# Patient Record
Sex: Female | Born: 1960 | Race: White | Hispanic: No | Marital: Married | Smoking: Never smoker
Health system: Southern US, Community
[De-identification: ages and names within clinical notes are randomized; demographics above are authoritative.]

## PROBLEM LIST (undated history)

## (undated) DIAGNOSIS — E119 Type 2 diabetes mellitus without complications: Secondary | ICD-10-CM

## (undated) DIAGNOSIS — N289 Disorder of kidney and ureter, unspecified: Secondary | ICD-10-CM

## (undated) DIAGNOSIS — I509 Heart failure, unspecified: Secondary | ICD-10-CM

## (undated) DIAGNOSIS — I10 Essential (primary) hypertension: Secondary | ICD-10-CM

## (undated) DIAGNOSIS — K5090 Crohn's disease, unspecified, without complications: Secondary | ICD-10-CM

## (undated) DIAGNOSIS — K3184 Gastroparesis: Secondary | ICD-10-CM

## (undated) DIAGNOSIS — I252 Old myocardial infarction: Secondary | ICD-10-CM

## (undated) HISTORY — PX: NEPHRECTOMY: SHX65

## (undated) HISTORY — PX: TONSILLECTOMY: SUR1361

## (undated) HISTORY — PX: ABDOMINAL HYSTERECTOMY: SHX81

---

## 2018-02-26 ENCOUNTER — Emergency Department (HOSPITAL_COMMUNITY)
Admission: EM | Admit: 2018-02-26 | Discharge: 2018-02-26 | Disposition: A | Discharge status: Home / Self Care | Payer: BLUE CROSS/BLUE SHIELD | Attending: Emergency Medicine | Admitting: Emergency Medicine

## 2018-02-26 ENCOUNTER — Other Ambulatory Visit: Payer: Self-pay

## 2018-02-26 ENCOUNTER — Encounter (HOSPITAL_COMMUNITY): Payer: Self-pay | Admitting: Emergency Medicine

## 2018-02-26 DIAGNOSIS — I509 Heart failure, unspecified: Secondary | ICD-10-CM | POA: Diagnosis not present

## 2018-02-26 DIAGNOSIS — Z794 Long term (current) use of insulin: Secondary | ICD-10-CM | POA: Insufficient documentation

## 2018-02-26 DIAGNOSIS — E11641 Type 2 diabetes mellitus with hypoglycemia with coma: Secondary | ICD-10-CM | POA: Diagnosis present

## 2018-02-26 DIAGNOSIS — E162 Hypoglycemia, unspecified: Secondary | ICD-10-CM

## 2018-02-26 DIAGNOSIS — I110 Hypertensive heart disease with heart failure: Secondary | ICD-10-CM | POA: Insufficient documentation

## 2018-02-26 DIAGNOSIS — Z79899 Other long term (current) drug therapy: Secondary | ICD-10-CM | POA: Insufficient documentation

## 2018-02-26 HISTORY — DX: Essential (primary) hypertension: I10

## 2018-02-26 HISTORY — DX: Disorder of kidney and ureter, unspecified: N28.9

## 2018-02-26 HISTORY — DX: Heart failure, unspecified: I50.9

## 2018-02-26 HISTORY — DX: Old myocardial infarction: I25.2

## 2018-02-26 HISTORY — DX: Type 2 diabetes mellitus without complications: E11.9

## 2018-02-26 HISTORY — DX: Crohn's disease, unspecified, without complications: K50.90

## 2018-02-26 LAB — CBC WITH DIFFERENTIAL/PLATELET
BASOS ABS: 0 10*3/uL (ref 0.0–0.1)
BASOS PCT: 0 %
EOS ABS: 0 10*3/uL (ref 0.0–0.7)
EOS PCT: 0 %
HCT: 41.8 % (ref 36.0–46.0)
Hemoglobin: 13.4 g/dL (ref 12.0–15.0)
Lymphocytes Relative: 7 %
Lymphs Abs: 0.9 10*3/uL (ref 0.7–4.0)
MCH: 28.6 pg (ref 26.0–34.0)
MCHC: 32.1 g/dL (ref 30.0–36.0)
MCV: 89.1 fL (ref 78.0–100.0)
Monocytes Absolute: 1.4 10*3/uL — ABNORMAL HIGH (ref 0.1–1.0)
Monocytes Relative: 10 %
Neutro Abs: 11.6 10*3/uL — ABNORMAL HIGH (ref 1.7–7.7)
Neutrophils Relative %: 83 %
PLATELETS: 176 10*3/uL (ref 150–400)
RBC: 4.69 MIL/uL (ref 3.87–5.11)
RDW: 13.9 % (ref 11.5–15.5)
WBC: 14 10*3/uL — AB (ref 4.0–10.5)

## 2018-02-26 LAB — CBG MONITORING, ED
GLUCOSE-CAPILLARY: 107 mg/dL — AB (ref 65–99)
GLUCOSE-CAPILLARY: 51 mg/dL — AB (ref 65–99)
Glucose-Capillary: 86 mg/dL (ref 65–99)

## 2018-02-26 LAB — BASIC METABOLIC PANEL
ANION GAP: 11 (ref 5–15)
BUN: 25 mg/dL — AB (ref 6–20)
CO2: 28 mmol/L (ref 22–32)
Calcium: 10 mg/dL (ref 8.9–10.3)
Chloride: 100 mmol/L — ABNORMAL LOW (ref 101–111)
Creatinine, Ser: 0.99 mg/dL (ref 0.44–1.00)
GFR calc Af Amer: >60 mL/min (ref >60)
Glucose, Bld: 54 mg/dL — ABNORMAL LOW (ref 65–99)
POTASSIUM: 3.7 mmol/L (ref 3.5–5.1)
SODIUM: 139 mmol/L (ref 135–145)

## 2018-02-26 NOTE — ED Notes (Signed)
CBG of 51 taken EDP in room while collected.

## 2018-02-26 NOTE — ED Triage Notes (Signed)
cbg 31 upon ems arrival, pt was unresponsive.  Pt given glucagon and 1 amp d50 by ems.  Pt is awake and alert at this time.  Last cbg 109 per ems

## 2018-02-26 NOTE — ED Provider Notes (Signed)
[facility] DEPT Provider Note: Andrea Spurling, MD, FACEP  CSN: [id number] MRN: [id number] ARRIVAL: 02/26/18 at [place]: [place]  Hypoglycemia   HISTORY OF PRESENT ILLNESS  02/26/18 5:51 PM Andrea Hines is a 57 y.o. female with a history of diabetes on insulin.  She has not eaten since breakfast.  She felt her sugar was low and was attempting to prepare a meal when she became unresponsive.  EMS was called and they found her blood sugar to be 31.  They gave her IM glucagon and then IV D50 with improvement in her mental status and a repeat sugar of 109.  She is complaining of being cold.  She has a history of Crohn's disease and had profuse diarrhea yesterday but without blood.  She denies abdominal pain.  She is complaining of generalized pain.   Past Medical History:  Diagnosis Date  . CHF (congestive heart failure) ([place])   . Crohn disease ([place])   . Diabetes ([place])   . Hypertension   . MI, old   . Renal disorder    pt has one kidney     Past Surgical History:  Procedure Laterality Date  . ABDOMINAL HYSTERECTOMY    . NEPHRECTOMY Left   . TONSILLECTOMY      No family history on file.  Social History   Tobacco Use  . Smoking status: Never Smoker  . Smokeless tobacco: Never Used  Substance Use Topics  . Alcohol use: Not Currently  . Drug use: Not Currently    Prior to Admission medications   Medication Sig Start Date End Date Taking? Authorizing Provider  buPROPion (WELLBUTRIN XL) 300 MG 24 hr tablet Take 300 mg by mouth every morning. 11/16/17  Yes [provider]  cyanocobalamin (,VITAMIN B-12,) 1000 MCG/ML injection INJECT 1 ML INTO THE MUSCLE EVERY THIRTY DAYS 01/18/18  Yes [provider]  DULoxetine (CYMBALTA) 30 MG capsule Take 30 mg by mouth at bedtime. 02/16/18 03/18/18 Yes [provider]  famotidine (PEPCID) 40 MG tablet TAKE ONE TABLET BY MOUTH TWICE A DAY 01/18/18  Yes [provider]    Fluticasone-Salmeterol (ADVAIR DISKUS) 250-50 MCG/DOSE AEPB Inhale 1 puff into the lungs every 12 (twelve) hours as needed.   Yes [provider]  hydrochlorothiazide (HYDRODIURIL) 25 MG tablet  12/11/17  Yes [provider]  HYDROcodone-acetaminophen (NORCO/VICODIN) 5-325 MG tablet Norco 5 mg-325 mg tablet  Take 1 tablet every 4-6 hours by oral route as needed.   Yes [provider]  insulin aspart (NOVOLOG) 100 UNIT/ML FlexPen Inject 10 Units into the skin 3 (three) times daily with meals.   Yes [provider]  insulin degludec (TRESIBA FLEXTOUCH) 100 UNIT/ML SOPN FlexTouch Pen Inject 60 Units into the skin 3 (three) times daily.  02/19/18  Yes [provider]  losartan (COZAAR) 50 MG tablet Take 50 mg by mouth daily. 12/19/17  Yes [provider]  metFORMIN (GLUCOPHAGE-XR) 500 MG 24 hr tablet Take 1,000 mg by mouth 2 (two) times daily. 01/18/18  Yes [provider]  montelukast (SINGULAIR) 10 MG tablet Take 10 mg by mouth at bedtime. 10/23/17  Yes [provider]  pregabalin (LYRICA) 150 MG capsule Take 150 mg by mouth 2 (two) times daily. 12/19/17  Yes [provider]  rOPINIRole (REQUIP) 1 MG tablet Take 1 mg by mouth 3 (three) times daily. 08/24/17  Yes [provider]  traZODone (DESYREL) 100 MG tablet Take 200 mg by mouth at bedtime.  11/16/17 11/16/18 Yes [provider]  pioglitazone (ACTOS) 30 MG tablet Take 30 mg by mouth daily. 12/19/17   [provider]    Allergies Vancomycin; Levaquin [levofloxacin]; Penicillins; and Remicade [infliximab]   REVIEW OF SYSTEMS  Negative except as noted here or in the History of Present Illness.   PHYSICAL EXAMINATION  Initial Vital Signs There were no vitals taken for this visit.  Examination General: Well-developed, well-nourished female in no acute distress; appearance consistent with age of record HENT: normocephalic; atraumatic Eyes:  pupils equal, round and reactive to light; extraocular muscles intact Neck: supple Heart: regular rate and rhythm Lungs: clear to auscultation bilaterally Abdomen: soft; nondistended; nontender; bowel sounds present Extremities: No deformity; full range of motion; pulses normal Neurologic: Awake and alert, oriented; motor function intact in all extremities and symmetric; no facial droop Skin: Warm and dry Psychiatric: Flat affect   RESULTS  Summary of this visit's results, reviewed by myself:   EKG Interpretation  Date/Time:    Ventricular Rate:    PR Interval:    QRS Duration:   QT Interval:    QTC Calculation:   R Axis:     Text Interpretation:        Laboratory Studies: Results for orders placed or performed during the hospital encounter of 02/26/18 (from the past 24 hour(s))  CBG monitoring, ED     Status: Abnormal   Collection Time: 02/26/18  6:40 PM  Result Value Ref Range   Glucose-Capillary 107 (H) 65 - 99 mg/dL  CBC with Differential/Platelet     Status: Abnormal   Collection Time: 02/26/18  7:12 PM  Result Value Ref Range   WBC 14.0 (H) 4.0 - 10.5 K/uL   RBC 4.69 3.87 - 5.11 MIL/uL   Hemoglobin 13.4 12.0 - 15.0 g/dL   HCT 41.8 36.0 - 46.0 %   MCV 89.1 78.0 - 100.0 fL   MCH 28.6 26.0 - 34.0 pg   MCHC 32.1 30.0 - 36.0 g/dL   RDW 13.9 11.5 - 15.5 %   Platelets 176 150 - 400 K/uL   Neutrophils Relative % 83 %   Neutro Abs 11.6 (H) 1.7 - 7.7 K/uL   Lymphocytes Relative 7 %   Lymphs Abs 0.9 0.7 - 4.0 K/uL   Monocytes Relative 10 %   Monocytes Absolute 1.4 (H) 0.1 - 1.0 K/uL   Eosinophils Relative 0 %   Eosinophils Absolute 0.0 0.0 - 0.7 K/uL   Basophils Relative 0 %   Basophils Absolute 0.0 0.0 - 0.1 K/uL  Basic metabolic panel     Status: Abnormal   Collection Time: 02/26/18  7:12 PM  Result Value Ref Range   Sodium 139 135 - 145 mmol/L   Potassium 3.7 3.5 - 5.1 mmol/L   Chloride 100 (L) 101 - 111 mmol/L   CO2 28 22 - 32 mmol/L   Glucose, Bld 54 (L)  65 - 99 mg/dL   BUN 25 (H) 6 - 20 mg/dL   Creatinine, Ser 0.99 0.44 - 1.00 mg/dL   Calcium 10.0 8.9 - 10.3 mg/dL   GFR calc non Af Amer >60 >60 mL/min   GFR calc Af Amer >60 >60 mL/min   Anion gap 11 5 - 15  CBG monitoring, ED     Status: Abnormal   Collection Time: 02/26/18  8:03 PM  Result Value Ref Range   Glucose-Capillary 51 (L) 65 - 99 mg/dL  CBG monitoring, ED     Status: None   Collection  Time: 02/26/18  8:57 PM  Result Value Ref Range   Glucose-Capillary 86 65 - 99 mg/dL   Imaging Studies: No results found.  ED COURSE and MDM  Nursing notes and initial vitals signs, including pulse oximetry, reviewed.  Vitals:   02/26/18 1751 02/26/18 1800  BP: (!) 153/82 (!) 148/62  Pulse: 99 98  Resp: 18   Temp: (!) 97 F (36.1 C)   TempSrc: Oral   SpO2: 98% 99%  Weight: 86.2 kg (190 lb)   Height: 4' 11"  (1.499 m)    8:13 PM Feeling better after eating a meal but sugar dropped to 51.  We will observe and recheck her sugar at 9 PM.  9:00 PM Sugar up to 86.  Patient feeling better.  She states she is ready to go home.  She has a home glucose monitor that she can read through her iPhone and will check her sugars frequently.  PROCEDURES    ED DIAGNOSES     ICD-10-CM   1. Hypoglycemia E16.2        Shanon Rosser, MD 02/26/18 2102

## 2018-04-27 ENCOUNTER — Encounter (HOSPITAL_COMMUNITY): Payer: Self-pay | Admitting: Emergency Medicine

## 2018-04-27 ENCOUNTER — Emergency Department (HOSPITAL_COMMUNITY)
Admission: EM | Admit: 2018-04-27 | Discharge: 2018-04-27 | Disposition: A | Discharge status: Home / Self Care | Payer: BLUE CROSS/BLUE SHIELD | Attending: Emergency Medicine | Admitting: Emergency Medicine

## 2018-04-27 ENCOUNTER — Other Ambulatory Visit: Payer: Self-pay

## 2018-04-27 DIAGNOSIS — K5090 Crohn's disease, unspecified, without complications: Secondary | ICD-10-CM | POA: Diagnosis not present

## 2018-04-27 DIAGNOSIS — Z794 Long term (current) use of insulin: Secondary | ICD-10-CM | POA: Diagnosis not present

## 2018-04-27 DIAGNOSIS — I110 Hypertensive heart disease with heart failure: Secondary | ICD-10-CM | POA: Diagnosis not present

## 2018-04-27 DIAGNOSIS — R112 Nausea with vomiting, unspecified: Secondary | ICD-10-CM | POA: Diagnosis present

## 2018-04-27 DIAGNOSIS — E119 Type 2 diabetes mellitus without complications: Secondary | ICD-10-CM | POA: Insufficient documentation

## 2018-04-27 DIAGNOSIS — R197 Diarrhea, unspecified: Secondary | ICD-10-CM

## 2018-04-27 DIAGNOSIS — T50995A Adverse effect of other drugs, medicaments and biological substances, initial encounter: Secondary | ICD-10-CM | POA: Diagnosis not present

## 2018-04-27 DIAGNOSIS — I509 Heart failure, unspecified: Secondary | ICD-10-CM | POA: Diagnosis not present

## 2018-04-27 DIAGNOSIS — I252 Old myocardial infarction: Secondary | ICD-10-CM | POA: Diagnosis not present

## 2018-04-27 DIAGNOSIS — Z79899 Other long term (current) drug therapy: Secondary | ICD-10-CM | POA: Diagnosis not present

## 2018-04-27 DIAGNOSIS — T50905A Adverse effect of unspecified drugs, medicaments and biological substances, initial encounter: Secondary | ICD-10-CM

## 2018-04-27 LAB — DIFFERENTIAL
BASOS PCT: 0 %
Basophils Absolute: 0 10*3/uL (ref 0.0–0.1)
EOS ABS: 0.5 10*3/uL (ref 0.0–0.7)
Eosinophils Relative: 5 %
Lymphocytes Relative: 17 %
Lymphs Abs: 1.5 10*3/uL (ref 0.7–4.0)
MONOS PCT: 4 %
Monocytes Absolute: 0.4 10*3/uL (ref 0.1–1.0)
NEUTROS PCT: 74 %
Neutro Abs: 6.2 10*3/uL (ref 1.7–7.7)

## 2018-04-27 LAB — COMPREHENSIVE METABOLIC PANEL
ALT: 18 U/L (ref 14–54)
AST: 19 U/L (ref 15–41)
Albumin: 4 g/dL (ref 3.5–5.0)
Alkaline Phosphatase: 91 U/L (ref 38–126)
Anion gap: 10 (ref 5–15)
BUN: 16 mg/dL (ref 6–20)
CHLORIDE: 104 mmol/L (ref 101–111)
CO2: 26 mmol/L (ref 22–32)
Calcium: 9.5 mg/dL (ref 8.9–10.3)
Creatinine, Ser: 0.89 mg/dL (ref 0.44–1.00)
Glucose, Bld: 130 mg/dL — ABNORMAL HIGH (ref 65–99)
POTASSIUM: 4.1 mmol/L (ref 3.5–5.1)
Sodium: 140 mmol/L (ref 135–145)
Total Bilirubin: 0.7 mg/dL (ref 0.3–1.2)
Total Protein: 8 g/dL (ref 6.5–8.1)

## 2018-04-27 LAB — URINALYSIS, ROUTINE W REFLEX MICROSCOPIC
BILIRUBIN URINE: NEGATIVE
Glucose, UA: NEGATIVE mg/dL
Hgb urine dipstick: NEGATIVE
KETONES UR: NEGATIVE mg/dL
LEUKOCYTES UA: NEGATIVE
Nitrite: NEGATIVE
Protein, ur: 30 mg/dL — AB
Specific Gravity, Urine: 1.025 (ref 1.005–1.030)
pH: 5 (ref 5.0–8.0)

## 2018-04-27 LAB — CBC
HEMATOCRIT: 40.3 % (ref 36.0–46.0)
HEMOGLOBIN: 13.3 g/dL (ref 12.0–15.0)
MCH: 29.3 pg (ref 26.0–34.0)
MCHC: 33 g/dL (ref 30.0–36.0)
MCV: 88.8 fL (ref 78.0–100.0)
Platelets: 154 10*3/uL (ref 150–400)
RBC: 4.54 MIL/uL (ref 3.87–5.11)
RDW: 13.2 % (ref 11.5–15.5)
WBC: 8.2 10*3/uL (ref 4.0–10.5)

## 2018-04-27 LAB — LIPASE, BLOOD: LIPASE: 29 U/L (ref 11–51)

## 2018-04-27 MED ORDER — ONDANSETRON 4 MG PO TBDP
4.0000 mg | ORAL_TABLET | Freq: Once | ORAL | Status: AC | PRN
  Administered 2018-04-27: 4 mg via ORAL

## 2018-04-27 MED ORDER — SODIUM CHLORIDE 0.9 % IV SOLN: 1000.0000 mL | INTRAVENOUS | Status: DC

## 2018-04-27 MED ORDER — SODIUM CHLORIDE 0.9 % IV BOLUS (SEPSIS): 1000.0000 mL | Freq: Once | INTRAVENOUS | Status: DC

## 2018-04-27 MED ORDER — PROCHLORPERAZINE MALEATE 10 MG PO TABS: 10.0000 mg | ORAL_TABLET | Freq: Four times a day (QID) | ORAL | 0 refills | Status: AC | PRN

## 2018-04-27 MED ORDER — PANTOPRAZOLE SODIUM 40 MG PO TBEC
40.0000 mg | DELAYED_RELEASE_TABLET | Freq: Once | ORAL | Status: AC
  Administered 2018-04-27: 40 mg via ORAL
  Filled 2018-04-27: qty 1

## 2018-04-27 MED ORDER — ONDANSETRON 4 MG PO TBDP
ORAL_TABLET | ORAL | Status: AC
  Filled 2018-04-27: qty 1

## 2018-04-27 MED ORDER — FAMOTIDINE 20 MG PO TABS
20.0000 mg | ORAL_TABLET | Freq: Once | ORAL | Status: AC
Start: 2018-04-27 — End: 2018-04-27
  Administered 2018-04-27: 20 mg via ORAL
  Filled 2018-04-27: qty 1

## 2018-04-27 MED ORDER — FAMOTIDINE 20 MG PO TABS: 20.0000 mg | ORAL_TABLET | Freq: Two times a day (BID) | ORAL | 0 refills | Status: AC

## 2018-04-27 MED ORDER — PANTOPRAZOLE SODIUM 40 MG PO TBEC: 40.0000 mg | DELAYED_RELEASE_TABLET | Freq: Every day | ORAL | 0 refills | Status: AC

## 2018-04-27 NOTE — ED Notes (Signed)
Pt has had water and crackers   Inquires to how much longer she will be here

## 2018-04-27 NOTE — ED Notes (Signed)
PCP Armandina Gemma Weight loss physician Dr Lorne Skeens

## 2018-04-27 NOTE — ED Notes (Signed)
HB in to assess

## 2018-04-27 NOTE — Discharge Instructions (Addendum)
Vital signs are within normal limits.  Your chemistries, lipase, complete blood count are all within normal limits.  I suspect that you are having a flareup of your Crohn's problem.   please stop the promethazine for now.  Please use Compazine every 6 hours as needed for nausea, and/or vomiting.  This medication may cause drowsiness, please use it with caution.  Please use Pepcid 2 times daily, and use Protonix daily for the reflux and discomfort in your stomach.  Please see your GI specialist as soon as possible.  Please stop the Ozempic until you can discuss it with your prescribing physician.

## 2018-04-27 NOTE — ED Triage Notes (Signed)
Pt here for n/v/d after starting new diabetes medication ozempic Monday of this week.

## 2018-04-27 NOTE — ED Provider Notes (Signed)
[facility] EMERGENCY DEPARTMENT Provider Note   CSN: [id number] Arrival date & time: 04/27/18  1705     History   Chief Complaint Chief Complaint  Patient presents with  . Diarrhea    HPI Andrea Hines is a 57 y.o. female.  Patient is a 57 year old female to the emergency department with vomiting and diarrhea.  Patient has a history of diabetes mellitus, hypertension, congestive heart failure, and Crohn's disease.  The patient states that she has one kidney.  The patient is in transition.  She was living in the [place] area where she had a gastroenterologist who is following her for her Crohn's disease.  She is currently moved to this area and is scheduled to see a gastroenterologist soon, but has not seen anyone recently.  The patient states that on Monday, June 17 she began having problems with vomiting and diarrhea.  She says she is having as many as 5 and 6 episodes of vomiting and diarrhea daily.  She thinks that this may be related to her starting medication that was ordered by bariatric physicians - Ozempic.  She takes this medicine each week.  She had a dose on June 7, and then the following week she felt like she began to have symptoms of diarrhea and vomiting.  She says now that she has some mild general malaise.  She denies any high fever or chills.  She has not noticed any blood in her vomitus, or in her diarrhea.  She has not had any other changes in her medications.  There is been no significant changes in diet other than she says she is not eating or drinking as much as she should.  She has not been out of the country recently.  No other changes in medications reported at this time.     Past Medical History:  Diagnosis Date  . CHF (congestive heart failure) ([place])   . Crohn disease ([place])   . Diabetes ([place])   . Hypertension   . MI, old   . Renal disorder    pt has one kidney     There are no active problems to display for this patient.   Past Surgical History:    Procedure Laterality Date  . ABDOMINAL HYSTERECTOMY    . NEPHRECTOMY Left   . TONSILLECTOMY       OB History   None      Home Medications    Prior to Admission medications   Medication Sig Start Date End Date Taking? Authorizing Provider  buPROPion (WELLBUTRIN XL) 300 MG 24 hr tablet Take 300 mg by mouth every morning. 11/16/17   [provider]  cyanocobalamin (,VITAMIN B-12,) 1000 MCG/ML injection INJECT 1 ML INTO THE MUSCLE EVERY THIRTY DAYS 01/18/18   [provider]  DULoxetine (CYMBALTA) 30 MG capsule Take 30 mg by mouth at bedtime. 02/16/18 03/18/18  [provider]  famotidine (PEPCID) 40 MG tablet TAKE ONE TABLET BY MOUTH TWICE A DAY 01/18/18   [provider]  Fluticasone-Salmeterol (ADVAIR DISKUS) 250-50 MCG/DOSE AEPB Inhale 1 puff into the lungs every 12 (twelve) hours as needed.    [provider]  hydrochlorothiazide (HYDRODIURIL) 25 MG tablet  12/11/17   [provider]  HYDROcodone-acetaminophen (NORCO/VICODIN) 5-325 MG tablet Norco 5 mg-325 mg tablet  Take 1 tablet every 4-6 hours by oral route as needed.    [provider]  insulin aspart (NOVOLOG) 100 UNIT/ML FlexPen Inject 10 Units into the skin 3 (three) times daily with  meals.    [provider]  insulin degludec (TRESIBA FLEXTOUCH) 100 UNIT/ML SOPN FlexTouch Pen Inject 60 Units into the skin 3 (three) times daily.  02/19/18   [provider]  losartan (COZAAR) 50 MG tablet Take 50 mg by mouth daily. 12/19/17   [provider]  metFORMIN (GLUCOPHAGE-XR) 500 MG 24 hr tablet Take 1,000 mg by mouth 2 (two) times daily. 01/18/18   [provider]  montelukast (SINGULAIR) 10 MG tablet Take 10 mg by mouth at bedtime. 10/23/17   [provider]  pioglitazone (ACTOS) 30 MG tablet Take 30 mg by mouth daily. 12/19/17   [provider]  pregabalin (LYRICA) 150 MG capsule Take 150 mg by mouth 2 (two) times daily. 12/19/17    [provider]  rOPINIRole (REQUIP) 1 MG tablet Take 1 mg by mouth 3 (three) times daily. 08/24/17   [provider]  traZODone (DESYREL) 100 MG tablet Take 200 mg by mouth at bedtime. 11/16/17 11/16/18  [provider]    Family History History reviewed. No pertinent family history.  Social History Social History   Tobacco Use  . Smoking status: Never Smoker  . Smokeless tobacco: Never Used  Substance Use Topics  . Alcohol use: Not Currently  . Drug use: Not Currently     Allergies   Vancomycin; Levaquin [levofloxacin]; Penicillins; and Remicade [infliximab]   Review of Systems Review of Systems  Constitutional: Positive for activity change, appetite change and fatigue.       All ROS Neg except as noted in HPI  HENT: Negative for nosebleeds.   Eyes: Negative for photophobia and discharge.  Respiratory: Negative for cough, shortness of breath and wheezing.   Cardiovascular: Negative for chest pain and palpitations.  Gastrointestinal: Positive for diarrhea, nausea and vomiting. Negative for abdominal pain and blood in stool.  Genitourinary: Negative for dysuria, frequency and hematuria.  Musculoskeletal: Positive for myalgias. Negative for arthralgias, back pain and neck pain.  Skin: Negative.   Neurological: Negative for dizziness, seizures and speech difficulty.  Psychiatric/Behavioral: Negative for confusion and hallucinations.     Physical Exam Updated Vital Signs BP 101/73 (BP Location: Right Arm)   Pulse 91   Temp 97.8 F (36.6 C) (Oral)   SpO2 98%   Physical Exam  Constitutional: She is oriented to person, place, and time. She appears well-developed and well-nourished.  Non-toxic appearance.  HENT:  Head: Normocephalic.  Right Ear: Tympanic membrane and external ear normal.  Left Ear: Tympanic membrane and external ear normal.  Eyes: Pupils are equal, round, and reactive to light. EOM and lids are normal.  Neck: Normal range of  motion. Neck supple. Carotid bruit is not present.  Cardiovascular: Normal rate, regular rhythm, normal heart sounds, intact distal pulses and normal pulses.  Pulmonary/Chest: Breath sounds normal. No respiratory distress.  Abdominal: Soft. Bowel sounds are normal. She exhibits no mass. There is tenderness. There is no guarding.  Mild diffuse soreness of the upper and lower abdomen, but no focal areas of pain.  No distention of the abdomen.  No CVA tenderness appreciated.  Musculoskeletal: Normal range of motion.  Lymphadenopathy:       Head (right side): No submandibular adenopathy present.       Head (left side): No submandibular adenopathy present.    She has no cervical adenopathy.  Neurological: She is alert and oriented to person, place, and time. She has normal strength. No cranial nerve deficit or sensory deficit.  Skin: Skin is warm  and dry.  Psychiatric: She has a normal mood and affect. Her speech is normal.  Nursing note and vitals reviewed.    ED Treatments / Results  Labs (all labs ordered are listed, but only abnormal results are displayed) Labs Reviewed  CBC  LIPASE, BLOOD  COMPREHENSIVE METABOLIC PANEL  URINALYSIS, ROUTINE W REFLEX MICROSCOPIC    EKG None  Radiology No results found.  Procedures Procedures (including critical care time)  Medications Ordered in ED Medications  ondansetron (ZOFRAN-ODT) disintegrating tablet 4 mg (4 mg Oral Given 04/27/18 1718)     Initial Impression / Assessment and Plan / ED Course  I have reviewed the triage vital signs and the nursing notes.  Pertinent labs & imaging results that were available during my care of the patient were reviewed by me and considered in my medical decision making (see chart for details).       Final Clinical Impressions(s) / ED Diagnoses MDM  Vital signs reviewed. Pt has c/o N/V/D. Hx of Crohn's Disease. Questions if symptoms are from new medication - Ozempic.  Complete blood count  is within normal limits.  No evidence for systemic infection, or significant internal bleeding. Lipase is normal.  Doubt pancreas related nausea or vomiting or abdominal discomfort. The conference of metabolic panel is within normal limits with the exception of the glucose being elevated at 130.  Doubt that the nausea vomiting is renal or hepatic related. Urine analysis is noncontributory at this time.  Patient given a challenge with water as well as with crackers.  Patient was able to keep both of them down.  She says that her stomach is very gassy and rumbly and still feels sore.  I discussed the findings on the examination as well as the findings from the lab with the patient in terms of which she understands.  No indication at this time for need of IV fluids.  The patient is given a prescription for Compazine to use for her nausea.  She states that in spite of using the promethazine she is still having vomiting.  The patient describes what sounds like reflux.  Patient will be treated with Pepcid and Protonix until she can see her GI specialist.  Patient is invited to return to the emergency department immediately if any changes in condition, problems, or concerns.  I have asked the patient to stop the Ozempic until she can discuss her symptoms with the prescribing physician.   Final diagnoses:  Nausea vomiting and diarrhea  Crohn's disease without complication, unspecified gastrointestinal tract location [facility])  Medication adverse effect, initial encounter    ED Discharge Orders        Ordered    famotidine (PEPCID) 20 MG tablet  2 times daily     04/27/18 2006    pantoprazole (PROTONIX) 40 MG tablet  Daily     04/27/18 2006    prochlorperazine (COMPAZINE) 10 MG tablet  Every 6 hours PRN     04/27/18 2006       Lily Kocher, PA-C 04/27/18 2023    Mesner, Corene Cornea, MD 04/27/18 2042

## 2018-04-27 NOTE — ED Notes (Signed)
Has recently been in a weight loss program at [facility] In the last several days N/V/D Has not called PCP nor [facility] program  Pt reports that she does not know if it is her med cahnges from [facility], or is her chron's

## 2018-04-27 NOTE — ED Notes (Signed)
Pt reports decreased N  Given pos and soda crackers

## 2019-01-04 ENCOUNTER — Other Ambulatory Visit: Payer: Self-pay

## 2019-01-04 ENCOUNTER — Emergency Department (HOSPITAL_COMMUNITY): Payer: BLUE CROSS/BLUE SHIELD

## 2019-01-04 ENCOUNTER — Encounter (HOSPITAL_COMMUNITY): Payer: Self-pay | Admitting: Emergency Medicine

## 2019-01-04 ENCOUNTER — Emergency Department (HOSPITAL_COMMUNITY)
Admission: EM | Admit: 2019-01-04 | Discharge: 2019-01-04 | Disposition: A | Discharge status: Home / Self Care | Payer: BLUE CROSS/BLUE SHIELD | Attending: Emergency Medicine | Admitting: Emergency Medicine

## 2019-01-04 DIAGNOSIS — Z794 Long term (current) use of insulin: Secondary | ICD-10-CM | POA: Diagnosis not present

## 2019-01-04 DIAGNOSIS — I252 Old myocardial infarction: Secondary | ICD-10-CM | POA: Diagnosis not present

## 2019-01-04 DIAGNOSIS — R1084 Generalized abdominal pain: Secondary | ICD-10-CM | POA: Insufficient documentation

## 2019-01-04 DIAGNOSIS — R51 Headache: Secondary | ICD-10-CM | POA: Insufficient documentation

## 2019-01-04 DIAGNOSIS — Z79899 Other long term (current) drug therapy: Secondary | ICD-10-CM | POA: Insufficient documentation

## 2019-01-04 DIAGNOSIS — E119 Type 2 diabetes mellitus without complications: Secondary | ICD-10-CM | POA: Diagnosis not present

## 2019-01-04 DIAGNOSIS — R112 Nausea with vomiting, unspecified: Secondary | ICD-10-CM | POA: Diagnosis present

## 2019-01-04 DIAGNOSIS — I110 Hypertensive heart disease with heart failure: Secondary | ICD-10-CM | POA: Insufficient documentation

## 2019-01-04 DIAGNOSIS — I509 Heart failure, unspecified: Secondary | ICD-10-CM | POA: Insufficient documentation

## 2019-01-04 HISTORY — DX: Gastroparesis: K31.84

## 2019-01-04 LAB — CBC WITH DIFFERENTIAL/PLATELET
ABS IMMATURE GRANULOCYTES: 0.02 10*3/uL (ref 0.00–0.07)
Basophils Absolute: 0 10*3/uL (ref 0.0–0.1)
Basophils Relative: 0 %
EOS ABS: 0 10*3/uL (ref 0.0–0.5)
Eosinophils Relative: 0 %
HEMATOCRIT: 42.5 % (ref 36.0–46.0)
HEMOGLOBIN: 14.1 g/dL (ref 12.0–15.0)
IMMATURE GRANULOCYTES: 0 %
LYMPHS ABS: 0.3 10*3/uL — AB (ref 0.7–4.0)
LYMPHS PCT: 5 %
MCH: 28.7 pg (ref 26.0–34.0)
MCHC: 33.2 g/dL (ref 30.0–36.0)
MCV: 86.4 fL (ref 80.0–100.0)
MONOS PCT: 6 %
Monocytes Absolute: 0.3 10*3/uL (ref 0.1–1.0)
NEUTROS ABS: 4.7 10*3/uL (ref 1.7–7.7)
NEUTROS PCT: 89 %
Platelets: 118 10*3/uL — ABNORMAL LOW (ref 150–400)
RBC: 4.92 MIL/uL (ref 3.87–5.11)
RDW: 13.1 % (ref 11.5–15.5)
WBC: 5.4 10*3/uL (ref 4.0–10.5)
nRBC: 0 % (ref 0.0–0.2)

## 2019-01-04 LAB — URINALYSIS, ROUTINE W REFLEX MICROSCOPIC
BACTERIA UA: NONE SEEN
BILIRUBIN URINE: NEGATIVE
Glucose, UA: >=500 mg/dL — AB
Hgb urine dipstick: NEGATIVE
KETONES UR: 20 mg/dL — AB
LEUKOCYTE UA: NEGATIVE
Nitrite: NEGATIVE
PROTEIN: 30 mg/dL — AB
Specific Gravity, Urine: 1.025 (ref 1.005–1.030)
pH: 6 (ref 5.0–8.0)

## 2019-01-04 LAB — COMPREHENSIVE METABOLIC PANEL
ALBUMIN: 4 g/dL (ref 3.5–5.0)
ALK PHOS: 90 U/L (ref 38–126)
ALT: 30 U/L (ref 0–44)
AST: 22 U/L (ref 15–41)
Anion gap: 12 (ref 5–15)
BILIRUBIN TOTAL: 1.3 mg/dL — AB (ref 0.3–1.2)
BUN: 17 mg/dL (ref 6–20)
CALCIUM: 9 mg/dL (ref 8.9–10.3)
CO2: 28 mmol/L (ref 22–32)
Chloride: 95 mmol/L — ABNORMAL LOW (ref 98–111)
Creatinine, Ser: 0.93 mg/dL (ref 0.44–1.00)
GFR calc Af Amer: >60 mL/min (ref >60)
GFR calc non Af Amer: >60 mL/min (ref >60)
GLUCOSE: 334 mg/dL — AB (ref 70–99)
Potassium: 4.4 mmol/L (ref 3.5–5.1)
SODIUM: 135 mmol/L (ref 135–145)
TOTAL PROTEIN: 7.3 g/dL (ref 6.5–8.1)

## 2019-01-04 LAB — LIPASE, BLOOD: Lipase: 36 U/L (ref 11–51)

## 2019-01-04 LAB — TROPONIN I: Troponin I: <0.03 ng/mL (ref <0.03)

## 2019-01-04 LAB — LACTIC ACID, PLASMA
LACTIC ACID, VENOUS: 1.6 mmol/L (ref 0.5–1.9)
Lactic Acid, Venous: 1.4 mmol/L (ref 0.5–1.9)

## 2019-01-04 MED ORDER — ONDANSETRON HCL 4 MG/2ML IJ SOLN
4.0000 mg | Freq: Once | INTRAMUSCULAR | Status: AC
  Administered 2019-01-04: 4 mg via INTRAVENOUS
  Filled 2019-01-04: qty 2

## 2019-01-04 MED ORDER — IOHEXOL 300 MG/ML  SOLN
80.0000 mL | Freq: Once | INTRAMUSCULAR | Status: AC | PRN
  Administered 2019-01-04: 80 mL via INTRAVENOUS

## 2019-01-04 MED ORDER — METOCLOPRAMIDE HCL 5 MG/ML IJ SOLN
10.0000 mg | Freq: Once | INTRAMUSCULAR | Status: AC
  Administered 2019-01-04: 10 mg via INTRAVENOUS
  Filled 2019-01-04: qty 2

## 2019-01-04 MED ORDER — SODIUM CHLORIDE 0.9 % IV BOLUS
1000.0000 mL | Freq: Once | INTRAVENOUS | Status: AC
  Administered 2019-01-04: 1000 mL via INTRAVENOUS

## 2019-01-04 MED ORDER — ONDANSETRON 4 MG PO TBDP
4.0000 mg | ORAL_TABLET | Freq: Three times a day (TID) | ORAL | 0 refills | Status: AC | PRN
Start: 2019-01-04 — End: ?

## 2019-01-04 NOTE — ED Provider Notes (Signed)
[facility] EMERGENCY DEPARTMENT Provider Note   CSN: [id number] Arrival date & time: 01/04/19  6606    History   Chief Complaint Chief Complaint  Patient presents with  . Emesis    HPI Andrea Hines is a 58 y.o. female.     HPI  Pt was seen at 0955. Per pt, c/o gradual onset and persistence of multiple intermittent episodes of N/V that began yesterday. Has been associated with generalized abd pain and frontal headache. States her husband had similar symptoms several days before her. Pt's husband states pt has been unable to tol PO due to her symptoms. Denies headache was sudden or maximal at onset or at any time. Denies eye pain, no eye injury, no visual changes, no focal motor weakness, no tingling/numbness in extremities.  Denies diarrhea, no CP/SOB, no back pain, no fevers, no black or blood in stools or emesis.    Past Medical History:  Diagnosis Date  . CHF (congestive heart failure) ([place])   . Crohn disease ([place])   . Diabetes ([place])   . Gastroparesis   . Hypertension   . MI, old   . Renal disorder    pt has one kidney     There are no active problems to display for this patient.   Past Surgical History:  Procedure Laterality Date  . ABDOMINAL HYSTERECTOMY    . NEPHRECTOMY Left   . TONSILLECTOMY       OB History   No obstetric history on file.      Home Medications    Prior to Admission medications   Medication Sig Start Date End Date Taking? Authorizing Provider  buPROPion (WELLBUTRIN XL) 300 MG 24 hr tablet Take 300 mg by mouth every morning. 11/16/17   [provider]  cyanocobalamin (,VITAMIN B-12,) 1000 MCG/ML injection INJECT 1 ML INTO THE MUSCLE EVERY THIRTY DAYS 01/18/18   [provider]  DULoxetine (CYMBALTA) 30 MG capsule Take 30 mg by mouth at bedtime. 02/16/18 04/27/18  [provider]  famotidine (PEPCID) 20 MG tablet Take 1 tablet (20 mg total) by mouth 2 (two) times daily. 04/27/18   Lily Kocher, PA-C    Fluticasone-Salmeterol (ADVAIR DISKUS) 250-50 MCG/DOSE AEPB Inhale 1 puff into the lungs every 12 (twelve) hours as needed.    [provider]  hydrochlorothiazide (HYDRODIURIL) 25 MG tablet  12/11/17   [provider]  insulin aspart (NOVOLOG) 100 UNIT/ML FlexPen Inject 10 Units into the skin 3 (three) times daily with meals.    [provider]  insulin degludec (TRESIBA FLEXTOUCH) 100 UNIT/ML SOPN FlexTouch Pen Inject 30 Units into the skin daily.  02/19/18   [provider]  losartan (COZAAR) 50 MG tablet Take 50 mg by mouth daily. 12/19/17   [provider]  metFORMIN (GLUCOPHAGE-XR) 500 MG 24 hr tablet Take 1,000 mg by mouth 2 (two) times daily. 01/18/18   [provider]  montelukast (SINGULAIR) 10 MG tablet Take 10 mg by mouth at bedtime. 10/23/17   [provider]  pantoprazole (PROTONIX) 40 MG tablet Take 1 tablet (40 mg total) by mouth daily. 04/27/18   Lily Kocher, PA-C  pregabalin (LYRICA) 150 MG capsule Take 150 mg by mouth 2 (two) times daily. 12/19/17   [provider]  prochlorperazine (COMPAZINE) 10 MG tablet Take 1 tablet (10 mg total) by mouth every 6 (six) hours as needed for nausea or vomiting. 04/27/18   Lily Kocher, PA-C  rOPINIRole (REQUIP) 1 MG tablet Take 1 mg by  mouth 3 (three) times daily. 08/24/17   [provider]  Semaglutide (OZEMPIC) 0.25 or 0.5 MG/DOSE SOPN Inject into the skin once a week.    [provider]  traZODone (DESYREL) 100 MG tablet Take 200 mg by mouth at bedtime. 11/16/17 11/16/18  [provider]    Family History No family history on file.  Social History Social History   Tobacco Use  . Smoking status: Never Smoker  . Smokeless tobacco: Never Used  Substance Use Topics  . Alcohol use: Not Currently  . Drug use: Not Currently     Allergies   Vancomycin; Levaquin [levofloxacin]; Penicillins; and Remicade [infliximab]   Review of  Systems Review of Systems ROS: Statement: All systems negative except as marked or noted in the HPI; Constitutional: Negative for fever and chills. ; ; Eyes: Negative for eye pain, redness and discharge. ; ; ENMT: Negative for ear pain, hoarseness, nasal congestion, sinus pressure and sore throat. ; ; Cardiovascular: Negative for chest pain, palpitations, diaphoresis, dyspnea and peripheral edema. ; ; Respiratory: Negative for cough, wheezing and stridor. ; ; Gastrointestinal: +N/V, abd pain. Negative for diarrhea, blood in stool, hematemesis, jaundice and rectal bleeding. . ; ; Genitourinary: Negative for dysuria, flank pain and hematuria. ; ; Musculoskeletal: Negative for back pain and neck pain. Negative for swelling and trauma.; ; Skin: Negative for pruritus, rash, abrasions, blisters, bruising and skin lesion.; ; Neuro: +headache. Negative for lightheadedness and neck stiffness. Negative for weakness, altered level of consciousness, altered mental status, extremity weakness, paresthesias, involuntary movement, seizure and syncope.       Physical Exam Updated Vital Signs BP (!) 157/87 (BP Location: Left Arm)   Pulse 100   Temp 97.9 F (36.6 C) (Oral)   Resp 18   Ht 4' 11"  (1.499 m)   Wt 79.8 kg   SpO2 99%   BMI 35.55 kg/m   Physical Exam 1000: Physical examination:  Nursing notes reviewed; Vital signs and O2 SAT reviewed;  Constitutional: Well developed, Well nourished, In no acute distress; Head:  Normocephalic, atraumatic; Eyes: EOMI, PERRL, No scleral icterus; ENMT: Mouth and pharynx normal, Mucous membranes dry; Neck: Supple, Full range of motion, No lymphadenopathy; Cardiovascular: Tachycardic rate and rhythm, No gallop; Respiratory: Breath sounds clear & equal bilaterally, No wheezes.  Speaking full sentences with ease, Normal respiratory effort/excursion; Chest: Nontender, Movement normal; Abdomen: Soft, +diffuse tenderness to palp. No rebound or guarding. Nondistended, Normal bowel  sounds; Genitourinary: No CVA tenderness; Extremities: Peripheral pulses normal, No tenderness, No edema, No calf edema or asymmetry.; Neuro: AA&Ox3, Major CN grossly intact. No facial droop. Speech clear. Grips equal. No gross focal motor or sensory deficits in extremities.; Skin: Color normal, Warm, Dry.   ED Treatments / Results  Labs (all labs ordered are listed, but only abnormal results are displayed)   EKG EKG Interpretation  Date/Time:  Friday January 04 2019 10:15:15 EST Ventricular Rate:  100 PR Interval:    QRS Duration: 125 QT Interval:  383 QTC Calculation: 494 R Axis:   128 Text Interpretation:  Sinus tachycardia RBBB and LPFB No old tracing to compare Confirmed by Francine Graven [phone]) on 01/04/2019 10:26:26 AM   Radiology   Procedures Procedures (including critical care time)  Medications Ordered in ED Medications  metoCLOPramide (REGLAN) injection 10 mg (has no administration in time range)  sodium chloride 0.9 % bolus 1,000 mL (has no administration in time range)     Initial Impression / Assessment and Plan / ED Course  I have reviewed the triage vital signs and the nursing notes.  Pertinent labs & imaging results that were available during my care of the patient were reviewed by me and considered in my medical decision making (see chart for details).     MDM Reviewed: previous chart, nursing note and vitals Reviewed previous: labs and ECG Interpretation: labs, x-ray, ECG and CT scan    Results for orders placed or performed during the hospital encounter of 01/04/19  Comprehensive metabolic panel  Result Value Ref Range   Sodium 135 135 - 145 mmol/L   Potassium 4.4 3.5 - 5.1 mmol/L   Chloride 95 (L) 98 - 111 mmol/L   CO2 28 22 - 32 mmol/L   Glucose, Bld 334 (H) 70 - 99 mg/dL   BUN 17 6 - 20 mg/dL   Creatinine, Ser 0.93 0.44 - 1.00 mg/dL   Calcium 9.0 8.9 - 10.3 mg/dL   Total Protein 7.3 6.5 - 8.1 g/dL   Albumin 4.0 3.5 - 5.0 g/dL    AST 22 15 - 41 U/L   ALT 30 0 - 44 U/L   Alkaline Phosphatase 90 38 - 126 U/L   Total Bilirubin 1.3 (H) 0.3 - 1.2 mg/dL   GFR calc non Af Amer >60 >60 mL/min   GFR calc Af Amer >60 >60 mL/min   Anion gap 12 5 - 15  Lipase, blood  Result Value Ref Range   Lipase 36 11 - 51 U/L  Troponin I - Once  Result Value Ref Range   Troponin I <0.03 <0.03 ng/mL  Lactic acid, plasma  Result Value Ref Range   Lactic Acid, Venous 1.6 0.5 - 1.9 mmol/L  Lactic acid, plasma  Result Value Ref Range   Lactic Acid, Venous 1.4 0.5 - 1.9 mmol/L  CBC with Differential  Result Value Ref Range   WBC 5.4 4.0 - 10.5 K/uL   RBC 4.92 3.87 - 5.11 MIL/uL   Hemoglobin 14.1 12.0 - 15.0 g/dL   HCT 42.5 36.0 - 46.0 %   MCV 86.4 80.0 - 100.0 fL   MCH 28.7 26.0 - 34.0 pg   MCHC 33.2 30.0 - 36.0 g/dL   RDW 13.1 11.5 - 15.5 %   Platelets 118 (L) 150 - 400 K/uL   nRBC 0.0 0.0 - 0.2 %   Neutrophils Relative % 89 %   Neutro Abs 4.7 1.7 - 7.7 K/uL   Lymphocytes Relative 5 %   Lymphs Abs 0.3 (L) 0.7 - 4.0 K/uL   Monocytes Relative 6 %   Monocytes Absolute 0.3 0.1 - 1.0 K/uL   Eosinophils Relative 0 %   Eosinophils Absolute 0.0 0.0 - 0.5 K/uL   Basophils Relative 0 %   Basophils Absolute 0.0 0.0 - 0.1 K/uL   Immature Granulocytes 0 %   Abs Immature Granulocytes 0.02 0.00 - 0.07 K/uL  Urinalysis, Routine w reflex microscopic  Result Value Ref Range   Color, Urine YELLOW YELLOW   APPearance CLEAR CLEAR   Specific Gravity, Urine 1.025 1.005 - 1.030   pH 6.0 5.0 - 8.0   Glucose, UA >=500 (A) NEGATIVE mg/dL   Hgb urine dipstick NEGATIVE NEGATIVE   Bilirubin Urine NEGATIVE NEGATIVE   Ketones, ur 20 (A) NEGATIVE mg/dL   Protein, ur 30 (A) NEGATIVE mg/dL   Nitrite NEGATIVE NEGATIVE   Leukocytes,Ua NEGATIVE NEGATIVE   RBC / HPF 0-5 0 - 5 RBC/hpf   WBC, UA 0-5 0 - 5 WBC/hpf   Bacteria, UA NONE  SEEN NONE SEEN   Squamous Epithelial / LPF 0-5 0 - 5   Dg Chest 2 View Result Date: 01/04/2019 CLINICAL DATA:   58 year old female with nausea vomiting and epigastric pain since yesterday. EXAM: CHEST - 2 VIEW COMPARISON:  None available. FINDINGS: Upright AP and lateral views of the chest. Mildly low lung volumes. Normal cardiac size and mediastinal contours. Visualized tracheal air column is within normal limits. No pneumothorax or pneumoperitoneum. Negative visible bowel gas pattern. No pulmonary edema, pleural effusion or confluent pulmonary opacity. Chronic deformity distal right clavicle. No acute osseous abnormality identified. IMPRESSION: No acute cardiopulmonary abnormality. Electronically Signed   By: Genevie Ann M.D.   On: 01/04/2019 11:30   Ct Head Wo Contrast Result Date: 01/04/2019 CLINICAL DATA:  Acute headache. Hyperglycemia with blood glucose level in the 300s. EXAM: CT HEAD WITHOUT CONTRAST TECHNIQUE: Contiguous axial images were obtained from the base of the skull through the vertex without intravenous contrast. COMPARISON:  None. FINDINGS: Brain: Ventricular system normal in size and appearance for age. Mild changes of small vessel disease of the white matter. No mass lesion. No midline shift. No acute hemorrhage or hematoma. No extra-axial fluid collections. No evidence of acute infarction. Vascular: LEFT vertebral artery atherosclerosis. No hyperdense vessel. Skull: No skull fracture or other focal osseous abnormality involving the skull. Sinuses/Orbits: Visualized paranasal sinuses, bilateral mastoid air cells and bilateral middle ear cavities well-aerated. Frontal sinuses are hypoplastic. Visualized orbits and globes normal in appearance. Other: None. IMPRESSION: 1. No acute intracranial abnormality. 2. Mild chronic microvascular ischemic changes of the white matter. Electronically Signed   By: Evangeline Dakin M.D.   On: 01/04/2019 12:33   Ct Abdomen Pelvis W Contrast Result Date: 01/04/2019 CLINICAL DATA:  Acute onset of generalized abdominal pain, nausea and vomiting that began yesterday.  Hyperglycemia with blood glucose level in the 300s. Current history of Crohn's disease. Surgical history includes hysterectomy and LEFT nephrectomy. EXAM: CT ABDOMEN AND PELVIS WITH CONTRAST TECHNIQUE: Multidetector CT imaging of the abdomen and pelvis was performed using the standard protocol following bolus administration of intravenous contrast. CONTRAST:  24m OMNIPAQUE IOHEXOL 300 MG/ML IV. COMPARISON:  09/12/2018, 05/07/2018 and earlier. FINDINGS: Lower chest: Heart size normal.  Visualized lung bases clear. Hepatobiliary: Calcified granuloma in the RIGHT lobe of the liver as noted previously. No significant abnormalities involving the liver. Multiple small gallstones in the gallbladder, the largest on the order of 7 mm. No pericholecystic inflammation. No biliary ductal dilation. Pancreas: Normal in appearance without evidence of mass, ductal dilation, or inflammation. Spleen: Multiple calcified granulomata as noted previously. No significant abnormality. Adrenals/Urinary Tract: Normal appearing adrenal glands. Prior LEFT nephrectomy. Normal-appearing RIGHT kidney. No hydronephrosis. No urinary tract calculi. No focal parenchymal abnormality. Normal appearing urinary bladder. Stomach/Bowel: Stomach normal in appearance for the degree of distention. Normal-appearing small bowel. Ileocolic anastomosis in the RIGHT mid abdomen which is widely patent. Expected stool burden throughout the normal appearing colon. Surgically absent appendix. Vascular/Lymphatic: Moderate aortoiliac atherosclerosis without evidence of aneurysm. Normal-appearing portal venous and systemic venous systems. No pathologic lymphadenopathy. Reproductive: Surgically absent uterus. Normal-appearing ovaries. No adnexal masses. Other: None. Musculoskeletal: Prior L4-5 POSTERIOR decompression and fusion. No acute findings. IMPRESSION: 1. No acute abnormalities involving the abdomen or pelvis. 2. Cholelithiasis without CT evidence of acute  cholecystitis. 3.  Aortic Atherosclerosis (ICD10-170.0) Electronically Signed   By: TEvangeline DakinM.D.   On: 01/04/2019 12:42    1505:  Pt has ambulated with steady gait, easy resps, NAD. Pt has  tol PO well while in the ED without N/V.  No stooling while in the ED.  Abd benign, resps easy, VSS. Feels better and wants to go home now. CBG elevated per hx, AG normal. No clear indication for admission at this time.  Dx and testing d/w pt and family.  Questions answered.  Verb understanding, agreeable to d/c home with outpt f/u.     Final Clinical Impressions(s) / ED Diagnoses   Final diagnoses:  None    ED Discharge Orders    None       Francine Graven, DO 01/07/19 [id number]

## 2019-01-04 NOTE — Discharge Instructions (Addendum)
Take the prescription as directed.  Increase your fluid intake (ie:  Gatoraide) for the next few days, as discussed.  Eat a bland diet and advance to your regular diet slowly as you can tolerate it.  Call your regular medical doctor today to schedule a follow up appointment next week.  Return to the Emergency Department immediately sooner if worsening.

## 2019-01-04 NOTE — ED Notes (Signed)
Notified need for urine sample.   Replied, "I got nothing in the tank".  Will notify staff if able.

## 2019-01-04 NOTE — ED Triage Notes (Signed)
N/V since yesterday. Husband recently had stomach bug. cbg in 300's.

## 2020-05-23 IMAGING — CT CT ABD-PELV W/ CM
2 of 5 series · 16 of 46 positions shown, 18 images · IV contrast (Isovue)
Comparison: 09/12/2018, 05/07/2018 and earlier.

CLINICAL DATA: Acute onset of generalized abdominal pain, nausea
and vomiting that began yesterday. Hyperglycemia with blood glucose
level in the 300s. Current history of Crohn's disease. Surgical
history includes hysterectomy and LEFT nephrectomy.

EXAM:
CT ABDOMEN AND PELVIS WITH CONTRAST
TECHNIQUE: Multidetector CT imaging of the abdomen and pelvis was performed
using the standard protocol following bolus administration of
intravenous contrast.
CONTRAST:  80mL OMNIPAQUE IOHEXOL 300 MG/ML IV.

[Series 2: axial (person_name) (person_name) · axial · 0.75mm/px · z∈[-740,-330]mm · 13 of 94 slices shown, 15 images]
[im 6/94  soft-tissue]
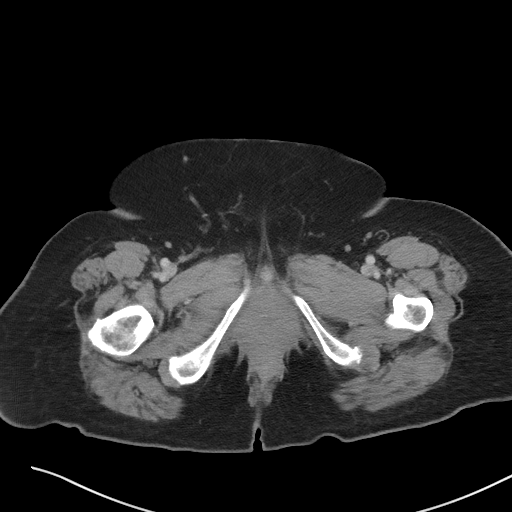
[im 6/94  bone]
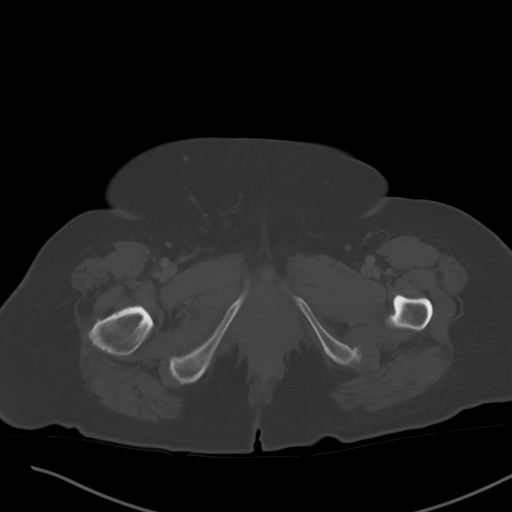
[im 11/94  soft-tissue]
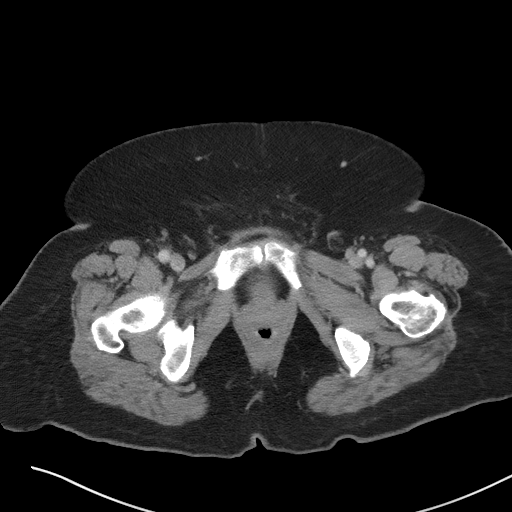
[im 21/94  soft-tissue]
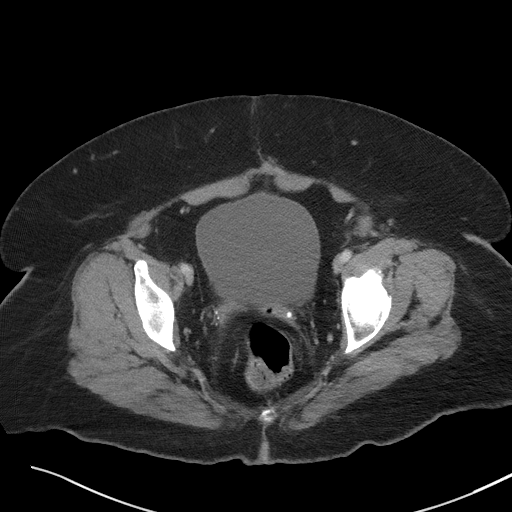
[im 26/94  soft-tissue]
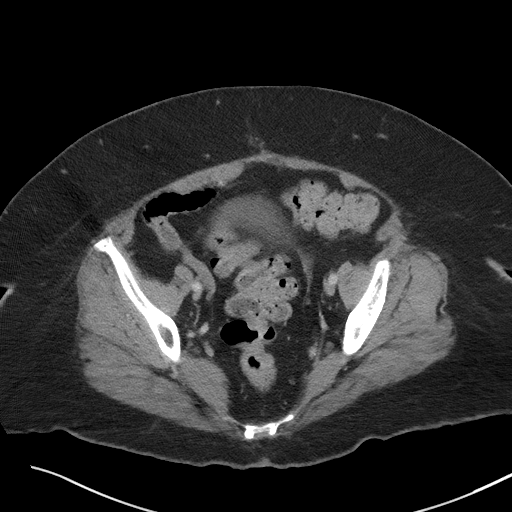
[im 32/94  soft-tissue]
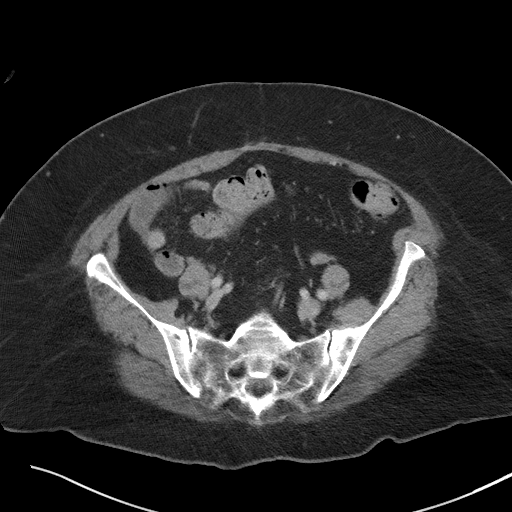
[im 42/94  soft-tissue]
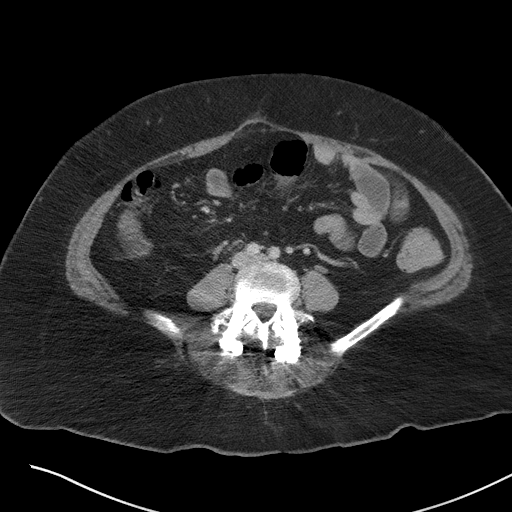
[im 47/94  soft-tissue]
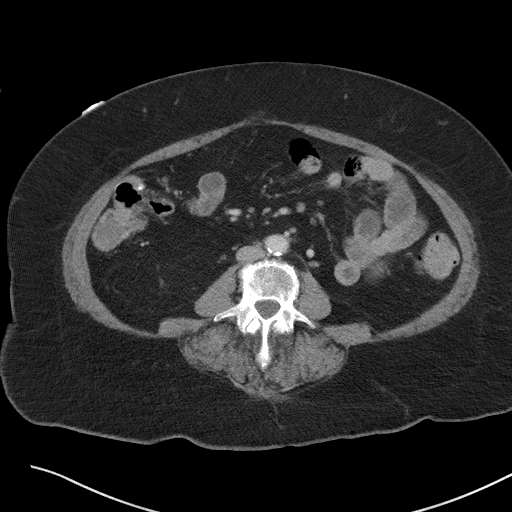
[im 52/94  soft-tissue]
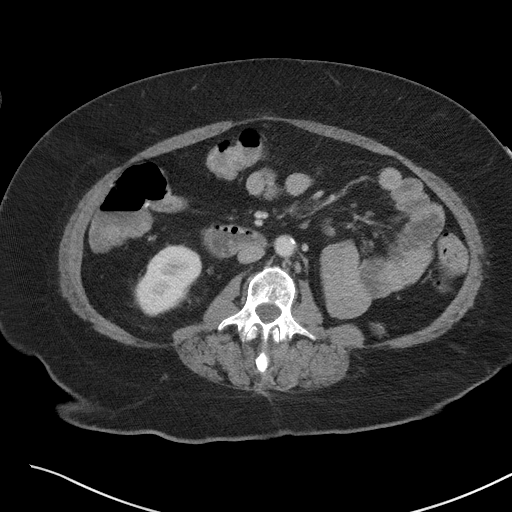
[im 63/94  soft-tissue]
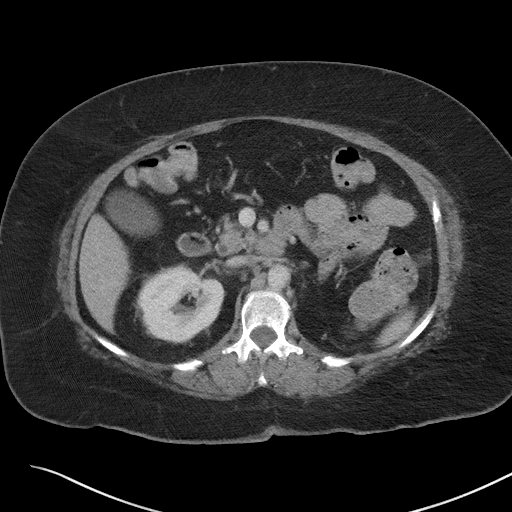
[im 63/94  bone]
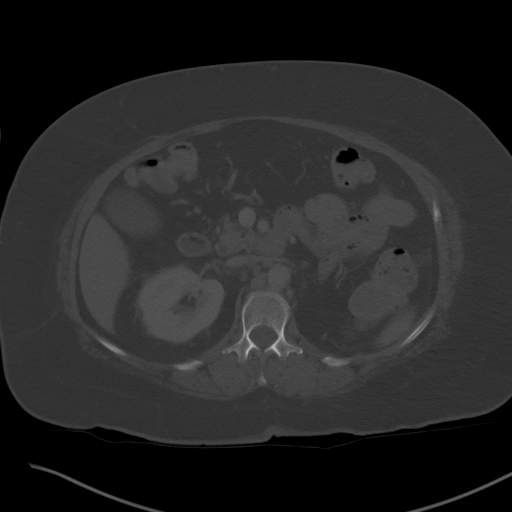
[im 68/94  soft-tissue]
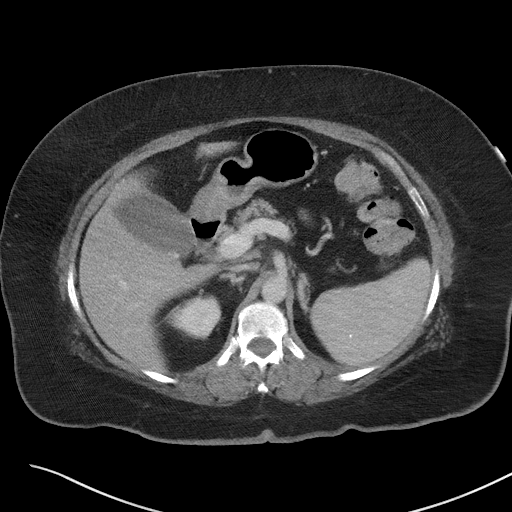
[im 73/94  soft-tissue]
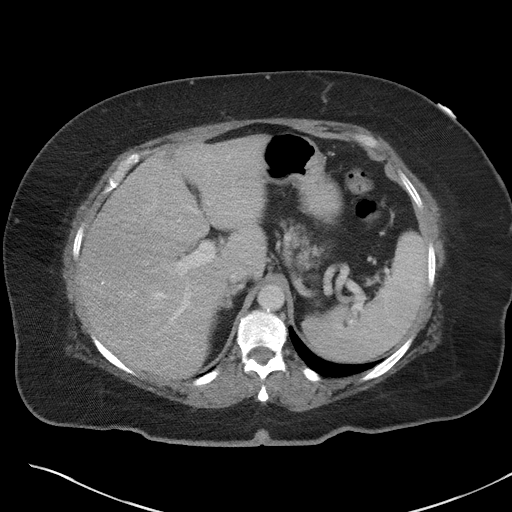
[im 83/94  soft-tissue]
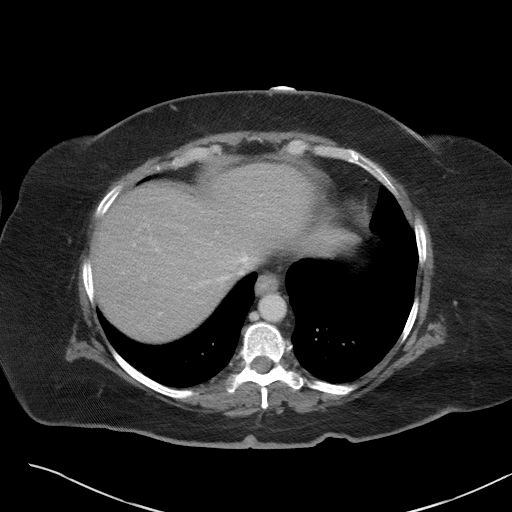
[im 88/94  soft-tissue]
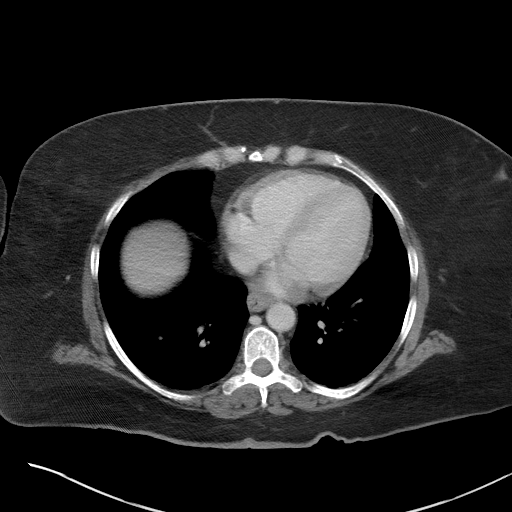

[Series 5: coronal st · coronal · 0.81mm/px · 3 of 96 slices shown]
[im 32/96  soft-tissue]
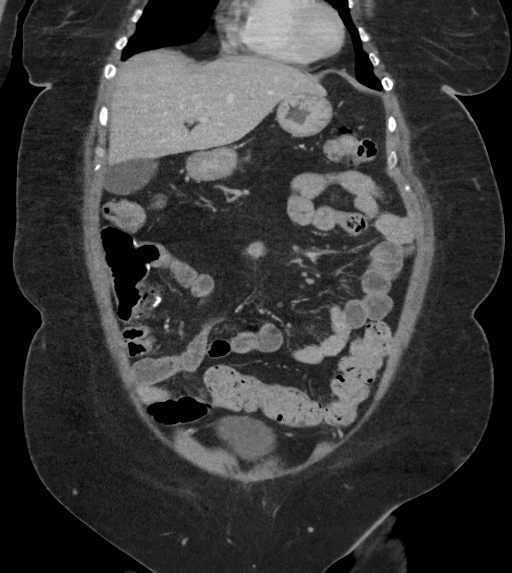
[im 43/96  soft-tissue]
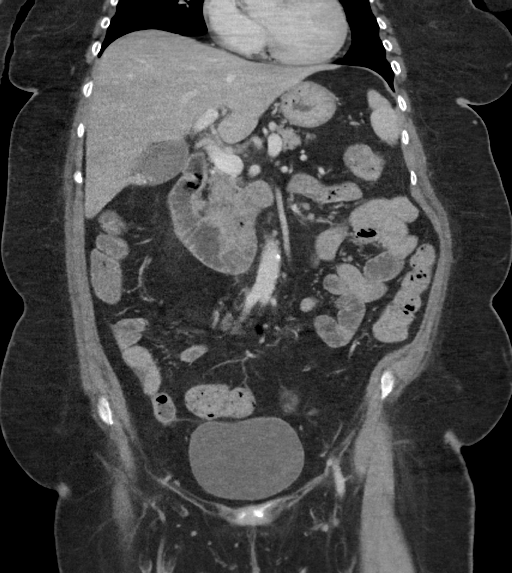
[im 53/96  soft-tissue]
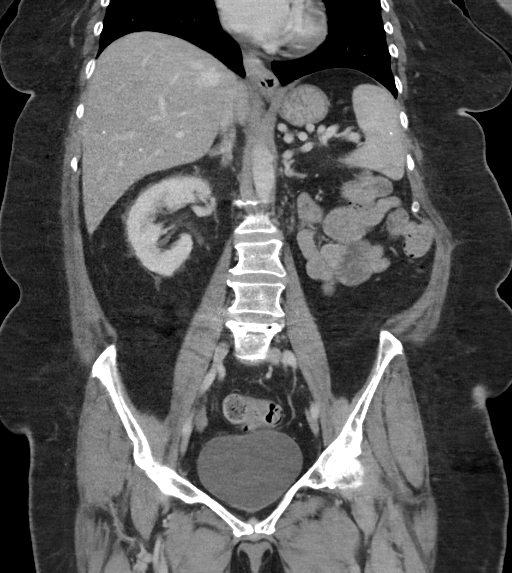

[16 of 46 positions shown; findings below may reference images not displayed]

FINDINGS: Lower chest: Heart size normal.  Visualized lung bases clear.

Hepatobiliary: Calcified granuloma in the RIGHT lobe of the liver as
noted previously. No significant abnormalities involving the liver.
Multiple small gallstones in the gallbladder, the largest on the
order of 7 mm. No pericholecystic inflammation. No biliary ductal
dilation.

Pancreas: Normal in appearance without evidence of mass, ductal
dilation, or inflammation.

Spleen: Multiple calcified granulomata as noted previously. No
significant abnormality.

Adrenals/Urinary Tract: Normal appearing adrenal glands. Prior LEFT
nephrectomy. Normal-appearing RIGHT kidney. No hydronephrosis. No
urinary tract calculi. No focal parenchymal abnormality. Normal
appearing urinary bladder.

Stomach/Bowel: Stomach normal in appearance for the degree of
distention. Normal-appearing small bowel. Ileocolic anastomosis in
the RIGHT mid abdomen which is widely patent. Expected stool burden
throughout the normal appearing colon. Surgically absent appendix.

Vascular/Lymphatic: Moderate aortoiliac atherosclerosis without
evidence of aneurysm. Normal-appearing portal venous and systemic
venous systems.

No pathologic lymphadenopathy.

Reproductive: Surgically absent uterus. Normal-appearing ovaries. No
adnexal masses.

Other: None.

Musculoskeletal: Prior L4-5 POSTERIOR decompression and fusion. No
acute findings.
IMPRESSION: 1. No acute abnormalities involving the abdomen or pelvis.
2. Cholelithiasis without CT evidence of acute cholecystitis.
3.  Aortic Atherosclerosis ([id number])
